# Patient Record
Sex: Male | Born: 1945 | Race: White | Hispanic: No | Marital: Married | Smoking: Never smoker
Health system: Southern US, Community
[De-identification: ages and names within clinical notes are randomized; demographics above are authoritative.]

## PROBLEM LIST (undated history)

## (undated) DIAGNOSIS — I10 Essential (primary) hypertension: Secondary | ICD-10-CM

## (undated) DIAGNOSIS — K219 Gastro-esophageal reflux disease without esophagitis: Secondary | ICD-10-CM

## (undated) DIAGNOSIS — E785 Hyperlipidemia, unspecified: Secondary | ICD-10-CM

## (undated) DIAGNOSIS — E039 Hypothyroidism, unspecified: Secondary | ICD-10-CM

## (undated) DIAGNOSIS — Z22322 Carrier or suspected carrier of Methicillin resistant Staphylococcus aureus: Secondary | ICD-10-CM

## (undated) DIAGNOSIS — Z8614 Personal history of Methicillin resistant Staphylococcus aureus infection: Secondary | ICD-10-CM

## (undated) DIAGNOSIS — E119 Type 2 diabetes mellitus without complications: Secondary | ICD-10-CM

## (undated) HISTORY — PX: TOE AMPUTATION: SHX809

## (undated) HISTORY — DX: Personal history of Methicillin resistant Staphylococcus aureus infection: Z86.14

## (undated) HISTORY — DX: Hypothyroidism, unspecified: E03.9

## (undated) HISTORY — DX: Carrier or suspected carrier of methicillin resistant Staphylococcus aureus: Z22.322

## (undated) HISTORY — DX: Essential (primary) hypertension: I10

## (undated) HISTORY — DX: Hyperlipidemia, unspecified: E78.5

## (undated) HISTORY — DX: Gastro-esophageal reflux disease without esophagitis: K21.9

## (undated) HISTORY — DX: Type 2 diabetes mellitus without complications: E11.9

---

## 2006-01-05 ENCOUNTER — Ambulatory Visit (HOSPITAL_COMMUNITY): Payer: Self-pay | Admitting: Pulmonary Disease

## 2006-01-05 ENCOUNTER — Encounter (HOSPITAL_COMMUNITY): Admission: RE | Admit: 2006-01-05 | Discharge: 2006-02-04 | Payer: Self-pay | Admitting: Pulmonary Disease

## 2006-01-06 ENCOUNTER — Encounter (HOSPITAL_COMMUNITY): Admission: RE | Admit: 2006-01-06 | Discharge: 2006-02-05 | Payer: Self-pay | Admitting: Pulmonary Disease

## 2006-02-05 ENCOUNTER — Encounter (HOSPITAL_COMMUNITY): Admission: RE | Admit: 2006-02-05 | Discharge: 2006-03-07 | Payer: Self-pay | Admitting: Oncology

## 2006-04-18 ENCOUNTER — Encounter (HOSPITAL_COMMUNITY): Admission: RE | Admit: 2006-04-18 | Discharge: 2006-05-18 | Payer: Self-pay | Admitting: Pulmonary Disease

## 2006-04-27 ENCOUNTER — Ambulatory Visit (HOSPITAL_COMMUNITY): Payer: Self-pay | Admitting: Pulmonary Disease

## 2006-05-19 ENCOUNTER — Encounter (HOSPITAL_COMMUNITY): Admission: RE | Admit: 2006-05-19 | Discharge: 2006-06-18 | Payer: Self-pay | Admitting: Pulmonary Disease

## 2011-11-08 HISTORY — PX: BELOW KNEE LEG AMPUTATION: SUR23

## 2012-05-06 DIAGNOSIS — D649 Anemia, unspecified: Secondary | ICD-10-CM | POA: Insufficient documentation

## 2014-06-11 ENCOUNTER — Encounter (HOSPITAL_BASED_OUTPATIENT_CLINIC_OR_DEPARTMENT_OTHER): Payer: Medicare HMO | Attending: General Surgery

## 2014-06-25 ENCOUNTER — Ambulatory Visit (HOSPITAL_COMMUNITY)
Admission: RE | Admit: 2014-06-25 | Discharge: 2014-06-25 | Disposition: A | Discharge status: Home / Self Care | Payer: Medicare HMO | Source: Ambulatory Visit | Attending: Nurse Practitioner | Admitting: Nurse Practitioner

## 2014-06-25 DIAGNOSIS — E119 Type 2 diabetes mellitus without complications: Secondary | ICD-10-CM | POA: Diagnosis not present

## 2014-06-25 DIAGNOSIS — S91109A Unspecified open wound of unspecified toe(s) without damage to nail, initial encounter: Secondary | ICD-10-CM | POA: Diagnosis not present

## 2014-06-25 DIAGNOSIS — IMO0001 Reserved for inherently not codable concepts without codable children: Secondary | ICD-10-CM | POA: Insufficient documentation

## 2014-06-25 NOTE — Progress Notes (Signed)
Physical Therapy - Wound Therapy  Evaluation   Patient Details  Name: Travis Simon MRN: [id number] Date of Birth: 02-12-1946  Today's Date: 06/25/2014 Time: 1015-1100 Time Calculation (min): 45 min   Charges: 1 Evaluation, Selective debridement <20cm Visit#: 1 of 8  Re-eval: 07/25/14  Subjective Subjective Assessment Subjective: Patient states he was feeling sick in late June when he noticed his foot was swelling when he foot slpit open because of the swelling between hi 2nd and 3 toes. Patient had a staph infection that has since been treated with medication and cleared. Patient has Rt BKA, Lt great toe amputations,  history of non healing wounds, diabetes, neuropathy,  3 heart attacks and history of triple bypass surgery. Patient is also HOH Patient and Family Stated Goals: to be able to walk without pain and for wound to heal Date of Onset: 05/07/14 Prior Treatments: home dressing changes twice daily with help from daughter who is a nurse  Pain Assessment No pain noted. Wound Therapy Wound / Incision (Open or Dehisced) 06/25/14 Other (Comment) Other (Comment) Left Wound located between 2nd and 3rd toe displayign minimal swelling with red tissue inside, difficulty to determine if granulating. Periwound area surrounded with dead skin.  (Active)  Dressing Type Gauze (Comment) 06/25/2014 12:44 PM  Dressing Changed Changed 06/25/2014 12:44 PM  Dressing Status Clean 06/25/2014 12:44 PM  Dressing Change Frequency Every other day 06/25/2014 12:44 PM  Site / Wound Assessment Red;Other (Comment) 06/25/2014 12:44 PM  % Wound base Red or Granulating 65% 06/25/2014 12:44 PM  % Wound base Yellow 15% 06/25/2014 12:44 PM  % Wound base Black 0% 06/25/2014 12:44 PM  % Wound base Other (Comment) 20% 06/25/2014 12:44 PM  Peri-wound Assessment Intact;Erythema (non-blanchable);Pink 06/25/2014 12:44 PM  Wound Length (cm) 1.6 cm 06/25/2014 12:44 PM  Wound Width (cm) 1 cm 06/25/2014 12:44 PM  Wound Depth (cm) 0.5  cm 06/25/2014 12:44 PM  Undermining (cm) 2 o'clock withdepth of .5cm 06/25/2014 12:44 PM  Margins Unattached edges (unapproximated) 06/25/2014 12:44 PM  Closure None 06/25/2014 12:44 PM  Drainage Amount Minimal 06/25/2014 12:44 PM  Drainage Description Serosanguineous;No odor 06/25/2014 12:44 PM  Treatment Cleansed;Debridement (Selective);Packing (Impregnated strip) 06/25/2014 12:44 PM   Selective Debridement Selective Debridement - Location: between 2nd and 3rd toe Selective Debridement - Tools Used: Forceps;Scalpel Selective Debridement - Tissue Removed: dead skin   Physical Therapy Assessment and Plan Wound Therapy - Assess/Plan/Recommendations Wound Therapy - Clinical Statement: Patient displays a non-healing wound between 2nd and 3rd toe attributed in limited circulation secondary to a previous staph infection. Patient's wound shows minimal exudate with slow healing. Honey was ultilized on packing due to slow healing to improve healing environement.  Wound Therapy - Functional Problem List: limited weightbearign on Lt LE and difficulty walking Factors Delaying/Impairing Wound Healing: Altered sensation;Diabetes Mellitus;Infection - systemic/local;Multiple medical problems;Vascular compromise Hydrotherapy Plan: Debridement;Dressing change Wound Therapy - Frequency:  (1 to 2x a week) Wound Plan: Dressign changes bi-daily performed at home independently with packing with gauze and honey to improve healing environment,       Goals Wound Therapy Goals - Improve the function of patient's integumentary system by progressing the wound(s) through the phases of wound healing by: Decrease Necrotic Tissue to: 0% Decrease Necrotic Tissue - Progress: Goal set today Increase Granulation Tissue to: 100% Increase Granulation Tissue - Progress: Goal set today Decrease Length/Width/Depth by (cm): 1.6x.5x1cm Decrease Length/Width/Depth - Progress: Goal set today Improve Drainage Characteristics:  Mod Improve Drainage Characteristics - Progress: Goal set today  Patient/Family will be able to : independently perform self dressing change every other day Patient/Family Instruction Goal - Progress: Goal set today Additional Wound Therapy Goal: patient will be able to tolerate full weight bearing on Lt LE to normalize gait Goals/treatment plan/discharge plan were made with and agreed upon by patient/family: Yes Time For Goal Achievement: Other (comment) (8 weeks) Wound Therapy - Potential for Goals: Good  Problem List There are no active problems to display for this patient.   GP Functional Assessment Tool Used: Colinical judgement Functional Limitation: Mobility: Walking and moving around Mobility: Walking and Moving Around Current Status [phone]([id number]): At least 60 percent but less than 80 percent impaired, limited or restricted Mobility: Walking and Moving Around Goal Status [phone](G8979): At least 40 percent but less than 60 percent impaired, limited or restricted  Travis Simon R 06/25/2014, 2:05 PM

## 2014-07-02 ENCOUNTER — Ambulatory Visit (HOSPITAL_COMMUNITY)
Admission: RE | Admit: 2014-07-02 | Discharge: 2014-07-02 | Disposition: A | Discharge status: Home / Self Care | Payer: Medicare HMO | Source: Ambulatory Visit | Attending: Nurse Practitioner | Admitting: Nurse Practitioner

## 2014-07-02 DIAGNOSIS — IMO0001 Reserved for inherently not codable concepts without codable children: Secondary | ICD-10-CM | POA: Diagnosis not present

## 2014-07-02 NOTE — Progress Notes (Signed)
Physical Therapy - Wound Therapy  Treatment   Patient Details  Name: Travis Simon MRN: [id number] Date of Birth: [birth year]  Today's Date: 07/02/2014 Time: 4098-1191 Time Calculation (min): 20 min Charge: selective debridement <20 cm  Visit#: 2 of 8  Re-eval: 07/25/14  Subjective Subjective Assessment Subjective: Pain free today, compliant with dressing changes  Pain Assessment Pain Assessment Pain Assessment: No/denies pain  Wound Therapy  07/02/14 1418  Subjective Assessment  Subjective Pain free today, compliant with dressing changes  Wound / Incision (Open or Dehisced)  Date First Assessed/Time First Assessed: 06/25/14 1024   Wound Type: (c) Other (Comment)  Location: (c) Other (Comment)  Location Orientation: Left  Wound Description (Comments): Wound located between 2nd and 3rd toe displayign minimal swelling with red   Dressing Type Other (Comment);Gauze (Comment) (medihoney)  Dressing Changed Changed  Dressing Status Clean  Site / Wound Assessment Red;Other (Comment) (minimal exudate)  % Wound base Red or Granulating 70%  % Wound base Yellow 10%  % Wound base Black 0%  % Wound base Other (Comment) 20% (non-granulated tissue)  Closure None  Drainage Amount Scant  Drainage Description Serosanguineous;No odor  Treatment Cleansed;Debridement (Selective)  Selective Debridement  Selective Debridement - Location between 2nd and 3rd toe  Selective Debridement - Tools Used Forceps;Scalpel;Other (comment) (qtips)  Selective Debridement - Tissue Removed dead skin  Wound Therapy - Assess/Plan/Recommendations  Wound Therapy - Clinical Statement Noted decreased depth of overall weight.  Slough decreased following minimal seletive debridement with forceps and qtips.  Continued with medihoney and gauze dressings.  No reports of pain or signs of infection  Hydrotherapy Plan Debridement;Dressing change  Wound Plan Dressign changes bi-daily performed at home independently with  packing with gauze and honey to improve healing environment,     Selective Debridement Selective Debridement - Location: between 2nd and 3rd toe Selective Debridement - Tools Used: Forceps;Scalpel;Other (comment) (qtips) Selective Debridement - Tissue Removed: dead skin   Physical Therapy Assessment and Plan Wound Therapy - Assess/Plan/Recommendations Wound Therapy - Clinical Statement: Noted decreased depth of overall weight.  Slough decreased following minimal seletive debridement with forceps and qtips.  Continued with medihoney and gauze dressings.  No reports of pain or signs of infection Hydrotherapy Plan: Debridement;Dressing change Wound Plan: Dressign changes bi-daily performed at home independently with packing with gauze and honey to improve healing environment,       Goals    Problem List There are no active problems to display for this patient.   GP    Juel Burrow 07/02/2014, 2:47 PM

## 2014-07-09 ENCOUNTER — Ambulatory Visit (HOSPITAL_COMMUNITY)
Admission: RE | Admit: 2014-07-09 | Discharge: 2014-07-09 | Disposition: A | Discharge status: Home / Self Care | Payer: Medicare HMO | Source: Ambulatory Visit | Attending: Nurse Practitioner | Admitting: Nurse Practitioner

## 2014-07-09 DIAGNOSIS — E119 Type 2 diabetes mellitus without complications: Secondary | ICD-10-CM | POA: Insufficient documentation

## 2014-07-09 DIAGNOSIS — S91109A Unspecified open wound of unspecified toe(s) without damage to nail, initial encounter: Secondary | ICD-10-CM | POA: Diagnosis not present

## 2014-07-09 DIAGNOSIS — IMO0001 Reserved for inherently not codable concepts without codable children: Secondary | ICD-10-CM | POA: Insufficient documentation

## 2014-07-09 NOTE — Progress Notes (Signed)
Physical Therapy - Wound Therapy  Treatment   Patient Details  Name: Travis Simon MRN: [id number] Date of Birth: Oct 02, 1946  Today's Date: 07/09/2014 Time: 4098-1191 Time Calculation (min): 20 min Visit#: 3 of 8  Re-eval: 07/25/14 Charges:  Deb<20cm  Subjective Subjective Assessment Subjective: Pt comes today without dressing on Lt foot, states he just removed it prior to session.  PT reports complaince with dressing changes at home using medihoney gel.     Wound Therapy Wound / Incision (Open or Dehisced) 06/25/14 Other (Comment) Other (Comment) Left Wound located between 2nd and 3rd toe displayign minimal swelling with red tissue inside, difficulty to determine if granulating. Periwound area surrounded with dead skin.  (Active)  Dressing Type medihoney gel gauze packed into wound, 3"conform to secure 07/09/2014 12:00 PM  Dressing Changed Changed 07/09/2014 12:00 PM  Dressing Status Clean 07/09/2014 12:00 PM  Dressing Change Frequency daily 06/25/2014 12:44 PM  Site / Wound Assessment Red;Other (Comment) 07/09/2014 12:00 PM  % Wound base Red or Granulating 70% 07/09/2014 12:00 PM  % Wound base Yellow 10% 07/09/2014 12:00 PM  % Wound base Black 0% 07/09/2014 12:00 PM  % Wound base Other (Comment) 20% 07/09/2014 12:00 PM  Peri-wound Assessment Intact;Erythema (non-blanchable);Pink 06/25/2014 12:44 PM  Wound Length (cm) 1.6 cm 07/09/2014 12:00 PM  Wound Width (cm) 1 cm 07/09/2014 12:00 PM  Wound Depth (cm) 0.5 cm 07/09/2014 12:00 PM  Undermining (cm) 2 o'clock withdepth of .5cm 06/25/2014 12:44 PM  Margins Unattached edges (unapproximated) 06/25/2014 12:44 PM  Closure None 07/09/2014 12:00 PM  Drainage Amount Scant 07/09/2014 12:00 PM  Drainage Description Serosanguineous;No odor 07/09/2014 12:00 PM  Treatment Cleansed;Debridement (Selective);Packing (Saline gauze) 07/09/2014 12:00 PM   Selective Debridement Selective Debridement - Location: between 2nd and 3rd toe Selective Debridement - Tools Used:  Forceps;Scalpel;Other (comment) (qtips) Selective Debridement - Tissue Removed: dead skin   Physical Therapy Assessment and Plan Wound Therapy - Assess/Plan/Recommendations Wound Therapy - Clinical Statement: Wound scabbed over with depth revealed upon removal.  Continues to drain; reminded patinet to keep wound packed so it does not heal over. Continued dressing with medihoney gel packing and 3" conform secured around foot and wound.    Hydrotherapy Plan: Debridement;Dressing change Wound Plan: Instuct patient to decrease dressing changes to once daily next visit (is currently changing 2X day).       Problem List There are no active problems to display for this patient.    Lurena Nida, PTA/CLT 07/09/2014, 12:24 PM

## 2014-07-15 ENCOUNTER — Telehealth (HOSPITAL_COMMUNITY): Payer: Self-pay

## 2014-07-16 ENCOUNTER — Ambulatory Visit (HOSPITAL_COMMUNITY): Payer: Medicare HMO | Admitting: Physical Therapy

## 2014-07-23 ENCOUNTER — Ambulatory Visit (HOSPITAL_COMMUNITY)
Admission: RE | Admit: 2014-07-23 | Discharge: 2014-07-23 | Disposition: A | Discharge status: Home / Self Care | Payer: Medicare HMO | Source: Ambulatory Visit | Attending: Nurse Practitioner | Admitting: Nurse Practitioner

## 2014-07-23 DIAGNOSIS — IMO0001 Reserved for inherently not codable concepts without codable children: Secondary | ICD-10-CM | POA: Diagnosis not present

## 2014-07-23 NOTE — Progress Notes (Signed)
Physical Therapy - Wound Therapy Discharge summary  Discharge   Patient Details  Name: Travis Simon MRN: [id number] Date of Birth: Mar 21, 1946  Today's Date: 07/23/2014 Time: 1100-1115 Time Calculation (min): 15 min  Visit#: 4 of 8  Re-eval: 07/25/14 Charges:  Self care 15 minutes  Subjective Subjective Assessment Subjective: Pt comes today stating he believes his wound is healed.  States he quit dressing it because there was nothing to pack.  Pt reports no other problems.  painfree   Wound Therapy Wound / Incision (Open or Dehisced) 06/25/14 Other (Comment) Other (Comment) Left Wound located between 2nd and 3rd toe displayign minimal swelling with red tissue inside, difficulty to determine if granulating. Periwound area surrounded with dead skin.  (Active)  Dressing Type None 07/23/2014 11:00 AM  Dressing Changed Changed 07/09/2014 12:00 PM  Dressing Status Clean 07/23/2014 11:00 AM  Dressing Change Frequency Every other day 06/25/2014 12:44 PM  Site / Wound Assessment Red;Other (Comment) 07/23/2014 11:00 AM  % Wound base Red or Granulating 100% 07/23/2014 11:00 AM  % Wound base Yellow 0% 07/23/2014 11:00 AM  % Wound base Black 0% 07/23/2014 11:00 AM  % Wound base Other (Comment) 0% 07/23/2014 11:00 AM  Peri-wound Assessment Intact;Erythema (non-blanchable);Pink 06/25/2014 12:44 PM  Wound Length (cm) 0 (was 1.6 cm) 07/09/2014 12:00 PM  Wound Width (cm) 0 (was 1 cm) 07/09/2014 12:00 PM  Wound Depth (cm) 0 (was 0.5 cm) 07/09/2014 12:00 PM  Undermining (cm) None (was 0.5cm @ 2:00) 06/25/2014 12:44 PM  Margins approximated 06/25/2014 12:44 PM  Closure Closed 07/23/2014 11:00 AM  Drainage Amount None 07/23/2014 11:00 AM  Drainage Description None 07/23/2014 11:00 AM  Treatment None 07/09/2014 12:00 PM   Selective Debridement Selective Debridement - Location: No debridment needed, no wound found   Physical Therapy Assessment and Plan Wound Therapy - Assess/Plan/Recommendations Wound Therapy -  Clinical Statement: Wound healed between 2nd/3rd toes.  continued redness at dorsum of both toes, however no signs/symptoms of infection.  Noted was one small wound on lateral aspect of 3rd toe.  wound measures 0.1X0.1X 0.2 cm.  Pt declined treatment at this time as he is returning to podiatrist at [facility] tomorrow.  Pt states his daughter is a [employer] scientist and will be able to take care of this new wound.  Pt instructed to use the medihoney and keep it covered.  Pt also instructed to moisturize feet to keep skin from cracking to prevent wounds.  Pt verbalized understanding.  Wound Plan: Discharge per original wound is healed and patient request.      Goals Wound Therapy Goals - Improve the function of patient's integumentary system by progressing the wound(s) through the phases of wound healing by: Decrease Necrotic Tissue to: 0% Decrease Necrotic Tissue - Progress: Met Increase Granulation Tissue to: 100% Increase Granulation Tissue - Progress: Met Decrease Length/Width/Depth by (cm): 1.6x.5x1cm Decrease Length/Width/Depth - Progress: Met Improve Drainage Characteristics: Mod Improve Drainage Characteristics - Progress: Met Patient/Family will be able to : independently perform self dressing change every other day Patient/Family Instruction Goal - Progress: Met Additional Wound Therapy Goal: patient will be able to tolerate full weight bearing on Lt LE to normalize gait Additional Wound Therapy Goal - Progress: Met Goals/treatment plan/discharge plan were made with and agreed upon by patient/family: Yes Time For Goal Achievement: Other (comment) (8 weeks) Wound Therapy - Potential for Goals: Good   GP Functional Assessment Tool Used: Clinical judgement Functional Limitation: Mobility: Walking and moving around Mobility: Walking and Moving Around Goal  Status [phone]): At least 40 percent but less than 60 percent impaired, limited or restricted Mobility: Walking and Moving Around Discharge Status  [phone]): At least 20 percent but less than 40 percent impaired, limited or restricted  Teena Irani, PTA/CLT 07/23/2014, 11:25 AM

## 2014-07-23 NOTE — Progress Notes (Signed)
Physical Therapy - Wound Therapy Discharge summary  Discharge   Patient Details  Name: Travis Simon MRN: [id number] Date of Birth: Mar 21, 1946  Today's Date: 07/23/2014 Time: 1100-1115 Time Calculation (min): 15 min  Visit#: 4 of 8  Re-eval: 07/25/14 Charges:  Self care 15 minutes  Subjective Subjective Assessment Subjective: Pt comes today stating he believes his wound is healed.  States he quit dressing it because there was nothing to pack.  Pt reports no other problems.  painfree   Wound Therapy Wound / Incision (Open or Dehisced) 06/25/14 Other (Comment) Other (Comment) Left Wound located between 2nd and 3rd toe displayign minimal swelling with red tissue inside, difficulty to determine if granulating. Periwound area surrounded with dead skin.  (Active)  Dressing Type None 07/23/2014 11:00 AM  Dressing Changed Changed 07/09/2014 12:00 PM  Dressing Status Clean 07/23/2014 11:00 AM  Dressing Change Frequency Every other day 06/25/2014 12:44 PM  Site / Wound Assessment Red;Other (Comment) 07/23/2014 11:00 AM  % Wound base Red or Granulating 100% 07/23/2014 11:00 AM  % Wound base Yellow 0% 07/23/2014 11:00 AM  % Wound base Black 0% 07/23/2014 11:00 AM  % Wound base Other (Comment) 0% 07/23/2014 11:00 AM  Peri-wound Assessment Intact;Erythema (non-blanchable);Pink 06/25/2014 12:44 PM  Wound Length (cm) 0 (was 1.6 cm) 07/09/2014 12:00 PM  Wound Width (cm) 0 (was 1 cm) 07/09/2014 12:00 PM  Wound Depth (cm) 0 (was 0.5 cm) 07/09/2014 12:00 PM  Undermining (cm) None (was 0.5cm @ 2:00) 06/25/2014 12:44 PM  Margins approximated 06/25/2014 12:44 PM  Closure Closed 07/23/2014 11:00 AM  Drainage Amount None 07/23/2014 11:00 AM  Drainage Description None 07/23/2014 11:00 AM  Treatment None 07/09/2014 12:00 PM   Selective Debridement Selective Debridement - Location: No debridment needed, no wound found   Physical Therapy Assessment and Plan Wound Therapy - Assess/Plan/Recommendations Wound Therapy -  Clinical Statement: Wound healed between 2nd/3rd toes.  continued redness at dorsum of both toes, however no signs/symptoms of infection.  Noted was one small wound on lateral aspect of 3rd toe.  wound measures 0.1X0.1X 0.2 cm.  Pt declined treatment at this time as he is returning to podiatrist at [facility] tomorrow.  Pt states his daughter is a Marine scientist and will be able to take care of this new wound.  Pt instructed to use the medihoney and keep it covered.  Pt also instructed to moisturize feet to keep skin from cracking to prevent wounds.  Pt verbalized understanding.  Wound Plan: Discharge per original wound is healed and patient request.      Goals Wound Therapy Goals - Improve the function of patient's integumentary system by progressing the wound(s) through the phases of wound healing by: Decrease Necrotic Tissue to: 0% Decrease Necrotic Tissue - Progress: Met Increase Granulation Tissue to: 100% Increase Granulation Tissue - Progress: Met Decrease Length/Width/Depth by (cm): 1.6x.5x1cm Decrease Length/Width/Depth - Progress: Met Improve Drainage Characteristics: Mod Improve Drainage Characteristics - Progress: Met Patient/Family will be able to : independently perform self dressing change every other day Patient/Family Instruction Goal - Progress: Met Additional Wound Therapy Goal: patient will be able to tolerate full weight bearing on Lt LE to normalize gait Additional Wound Therapy Goal - Progress: Met Goals/treatment plan/discharge plan were made with and agreed upon by patient/family: Yes Time For Goal Achievement: Other (comment) (8 weeks) Wound Therapy - Potential for Goals: Good   GP Functional Assessment Tool Used: Clinical judgement Functional Limitation: Mobility: Walking and moving around Mobility: Walking and Moving Around Goal  Status [phone]): At least 40 percent but less than 60 percent impaired, limited or restricted Mobility: Walking and Moving Around Discharge Status  [phone]): At least 20 percent but less than 40 percent impaired, limited or restricted  Devona Konig PT DPT  Teena Irani, PTA/CLT 07/23/2014, 11:25 AM

## 2014-07-30 ENCOUNTER — Ambulatory Visit (HOSPITAL_COMMUNITY)
Admission: RE | Admit: 2014-07-30 | Payer: Medicare HMO | Source: Ambulatory Visit | Attending: Nurse Practitioner | Admitting: Nurse Practitioner

## 2018-11-09 ENCOUNTER — Ambulatory Visit: Payer: Self-pay | Admitting: Podiatry

## 2018-11-19 ENCOUNTER — Encounter: Payer: Self-pay | Admitting: Podiatry

## 2018-11-19 ENCOUNTER — Ambulatory Visit (INDEPENDENT_AMBULATORY_CARE_PROVIDER_SITE_OTHER): Payer: Medicare HMO | Admitting: Podiatry

## 2018-11-19 ENCOUNTER — Ambulatory Visit (INDEPENDENT_AMBULATORY_CARE_PROVIDER_SITE_OTHER): Payer: Medicare HMO

## 2018-11-19 VITALS — BP 131/58

## 2018-11-19 DIAGNOSIS — E1142 Type 2 diabetes mellitus with diabetic polyneuropathy: Secondary | ICD-10-CM | POA: Diagnosis not present

## 2018-11-19 DIAGNOSIS — B351 Tinea unguium: Secondary | ICD-10-CM

## 2018-11-19 DIAGNOSIS — M205X2 Other deformities of toe(s) (acquired), left foot: Secondary | ICD-10-CM | POA: Diagnosis not present

## 2018-11-19 DIAGNOSIS — M869 Osteomyelitis, unspecified: Secondary | ICD-10-CM | POA: Diagnosis not present

## 2018-11-19 NOTE — Patient Instructions (Signed)
Diabetes Mellitus and Foot Care  Foot care is an important part of your health, especially when you have diabetes. Diabetes may cause you to have problems because of poor blood flow (circulation) to your feet and legs, which can cause your skin to:   Become thinner and drier.   Break more easily.   Heal more slowly.   Peel and crack.  You may also have nerve damage (neuropathy) in your legs and feet, causing decreased feeling in them. This means that you may not notice minor injuries to your feet that could lead to more serious problems. Noticing and addressing any potential problems early is the best way to prevent future foot problems.  How to care for your feet  Foot hygiene   Wash your feet daily with warm water and mild soap. Do not use hot water. Then, pat your feet and the areas between your toes until they are completely dry. Do not soak your feet as this can dry your skin.   Trim your toenails straight across. Do not dig under them or around the cuticle. File the edges of your nails with an emery board or nail file.   Apply a moisturizing lotion or petroleum jelly to the skin on your feet and to dry, brittle toenails. Use lotion that does not contain alcohol and is unscented. Do not apply lotion between your toes.  Shoes and socks   Wear clean socks or stockings every day. Make sure they are not too tight. Do not wear knee-high stockings since they may decrease blood flow to your legs.   Wear shoes that fit properly and have enough cushioning. Always look in your shoes before you put them on to be sure there are no objects inside.   To break in new shoes, wear them for just a few hours a day. This prevents injuries on your feet.  Wounds, scrapes, corns, and calluses   Check your feet daily for blisters, cuts, bruises, sores, and redness. If you cannot see the bottom of your feet, use a mirror or ask someone for help.   Do not cut corns or calluses or try to remove them with medicine.   If you  find a minor scrape, cut, or break in the skin on your feet, keep it and the skin around it clean and dry. You may clean these areas with mild soap and water. Do not clean the area with peroxide, alcohol, or iodine.   If you have a wound, scrape, corn, or callus on your foot, look at it several times a day to make sure it is healing and not infected. Check for:  ? Redness, swelling, or pain.  ? Fluid or blood.  ? Warmth.  ? Pus or a bad smell.  General instructions   Do not cross your legs. This may decrease blood flow to your feet.   Do not use heating pads or hot water bottles on your feet. They may burn your skin. If you have lost feeling in your feet or legs, you may not know this is happening until it is too late.   Protect your feet from hot and cold by wearing shoes, such as at the beach or on hot pavement.   Schedule a complete foot exam at least once a year (annually) or more often if you have foot problems. If you have foot problems, report any cuts, sores, or bruises to your health care provider immediately.  Contact a health care provider if:     You have a medical condition that increases your risk of infection and you have any cuts, sores, or bruises on your feet.   You have an injury that is not healing.   You have redness on your legs or feet.   You feel burning or tingling in your legs or feet.   You have pain or cramps in your legs and feet.   Your legs or feet are numb.   Your feet always feel cold.   You have pain around a toenail.  Get help right away if:   You have a wound, scrape, corn, or callus on your foot and:  ? You have pain, swelling, or redness that gets worse.  ? You have fluid or blood coming from the wound, scrape, corn, or callus.  ? Your wound, scrape, corn, or callus feels warm to the touch.  ? You have pus or a bad smell coming from the wound, scrape, corn, or callus.  ? You have a fever.  ? You have a red line going up your leg.  Summary   Check your feet every day  for cuts, sores, red spots, swelling, and blisters.   Moisturize feet and legs daily.   Wear shoes that fit properly and have enough cushioning.   If you have foot problems, report any cuts, sores, or bruises to your health care provider immediately.   Schedule a complete foot exam at least once a year (annually) or more often if you have foot problems.  This information is not intended to replace advice given to you by your health care provider. Make sure you discuss any questions you have with your health care provider.  Document Released: 10/21/2000 Document Revised: 12/06/2017 Document Reviewed: 11/25/2016  [employer] Interactive Patient Education  2019 [employer]

## 2018-11-19 NOTE — Progress Notes (Signed)
Subjective: Travis Simon presents today with diabetes and diabetic neuropathy.  He is a veteran usually seen at the [facility].  He was referred by the [facility] for podiatry.  He states approximately 4 to 5 months ago he had a blister on his left second digit.  He states this started with him walking in the river and digging his toes into the into the ground so he would not move with the current of the water.  This resulted in the formation of the blister.  He was seen by podiatry who removed his toenail.  He did have an x-ray done at the [facility] and no infection of the bone was discovered.  He also had an MRI done in December 2019 at the [facility] which also confirmed no osteomyelitis of the left second digit.  He has a new pair of diabetic shoes today.  Infectious disease history: He has history of MRSA  Amputation history: 1.  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 2.  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  Past Medical History:  Diagnosis Date  . 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 Diabetes (HCC)   . GERD (gastroesophageal reflux disease)   . History of methicillin resistant staphylococcus aureus (MRSA)   . Hyperlipidemia   . Hypertension   . Hypothyroidism    Patient Active Problem List   Diagnosis Date Noted  . Anemia 05/06/2012    History reviewed. No pertinent surgical history.  Past Surgical History:  Procedure Laterality Date  . BELOW KNEE LEG AMPUTATION Right 2013  . TOE AMPUTATION Left    Hallux    Current Outpatient Medications:  .  aspirin EC 81 MG tablet, Take by mouth., Disp: , Rfl:  .  furosemide (LASIX) 20 MG tablet, Take 20 mg by mouth., Disp: , Rfl:  .  glipiZIDE (GLUCOTROL) 5 MG tablet, Take by mouth 2 (two) times daily., Disp: , Rfl:  .  guaiFENesin 200 MG tablet, Take 200 mg by mouth 3 (three) times daily. 3 tablets BID, Disp: , Rfl:  .  insulin  aspart (NOVOLOG) 100 UNIT/ML injection, Inject into the skin., Disp: , Rfl:  .  insulin glargine (LANTUS) 100 UNIT/ML injection, Inject into the skin., Disp: , Rfl:  .  levothyroxine (SYNTHROID, LEVOTHROID) 50 MCG tablet, Take by mouth., Disp: , Rfl:  .  lisinopril (PRINIVIL,ZESTRIL) 5 MG tablet, Take by mouth., Disp: , Rfl:  .  metoprolol tartrate (LOPRESSOR) 25 MG tablet, Take by mouth., Disp: , Rfl:  .  pantoprazole (PROTONIX) 40 MG tablet, Take by mouth., Disp: , Rfl:  .  Pediatric Multiple Vitamins (THERA MULTI-VITAMIN PO), Take by mouth., Disp: , Rfl:  .  simvastatin (ZOCOR) 40 MG tablet, Take by mouth., Disp: , Rfl:     Social History   Occupational History  . Not on file  Tobacco Use  . Smoking status: Never Smoker  . Smokeless tobacco: Never Used  Substance and Sexual Activity  . Alcohol use: Not on file  . Drug use: Not on file  . Sexual activity: Not on file     History reviewed. No pertinent family history.   Immunization History  Administered Date(s) Administered  . Influenza,inj,Quad PF,6+ Mos 07/31/2014     Review of systems: Positive Findings in bold print.  Constitutional:  chills, fatigue, fever, sweats, weight change Communication: Nurse, learning disability, sign Presenter, broadcasting, hand writing, iPad/Android device  Head: headaches, head injury Eyes: changes in vision, eye pain, glaucoma, cataracts, macular degeneration, diplopia, glare,  light sensitivity, eyeglasses or contacts, blindness Ears nose mouth throat: Hard of hearing, hearing aids, ringing in ears, deaf, sign language,  vertigo,   nosebleeds,  rhinitis,  cold sores, snoring, swollen glands Cardiovascular: HTN, edema, arrhythmia, pacemaker in place, defibrillator in place,  chest pain/tightness, chronic anticoagulation, blood clot, heart failure Peripheral Vascular: leg cramps, varicose veins, blood clots, lymphedema Respiratory:  difficulty breathing, denies congestion, SOB, wheezing, cough,  emphysema Gastrointestinal: change in appetite or weight, abdominal pain, constipation, diarrhea, nausea, vomiting, vomiting blood, change in bowel habits, abdominal pain, jaundice, rectal bleeding, hemorrhoids, reflux Genitourinary:  nocturia,  pain on urination,  blood in urine, Foley catheter, urinary urgency Musculoskeletal: uses mobility aid,  cramping, stiff joints, painful joints, decreased joint motion, fractures, OA, gout, amputation Skin: +changes in toenails, color change, dryness, itching, mole changes,  rash  Neurological: headaches, numbness in feet, paresthesias in feet, burning in feet, fainting,  seizures, change in speech. denies headaches, memory problems/poor historian, cerebral palsy, weakness, paralysis Endocrine: diabetes, hypothyroidism, hyperthyroidism,  goiter, dry mouth, flushing, heat intolerance,  cold intolerance,  excessive thirst, denies polyuria,  nocturia Hematological:  easy bleeding, excessive bleeding, easy bruising, enlarged lymph nodes, on long term blood thinner, history of past transusions Allergy/immunological:  hives, eczema, frequent infections, multiple drug allergies, seasonal allergies, transplant recipient Psychiatric:  anxiety, depression, mood disorder, suicidal ideations, hallucinations   Objective: Vitals:   11/19/18 1532  BP: (!) 131/58   Vascular Examination: Capillary refill time <3 seconds x 4 digits Dorsalis pedis and posterior tibial pulses present left lower extremity  No digital hair x 4 digits left foot  skin temperature gradient within normal limits left lower extremity  Dermatological Examination: Skin with normal turgor, texture and tone left lower extremity  Toenails 3 through 5 left foot noted to be mildly elongated, thickened, and discolored with subungual debris.  There is no erythema, no edema, no drainage noted.  Left second digit noted to have characteristics of claw toe deformity.  There is chronic edema of this digit.   The digit is devoid of the nail.  There is mild hyperkeratosis noted at the nail bed and distal tip of the digit.  There is no drainage expressed from this digit.  Musculoskeletal: Muscle strength 5/5 to all LLE muscle groups Claw toe deformity left second digit with medial drifting  Neurological: Sensation diminished with 10 gram monofilament left lower extremity Vibratory sensation diminished left lower extremity  X-rays left foot: Calcified arteries are noted on x-ray He does have evidence of claw toe deformity on x-ray however bones appear to remain intact with no osteolysis of bone.  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 Discussed findings in depth with patient and his wife on today. 2. I do not see any evidence of osteomyelitis on x-ray which was performed at an office today.  He did have an MRI done at [facility] last month and I am requesting those records to review the MRI report before we go any further. 3. We did discuss a toe filler for his left shoe.  He states he had has a podiatrist in [place] who took a mold of his foot.  He is puzzled as to why he did not receive the toe filler for this shoe.  He will call the Pedorthist to get clarification regarding this insert. 4.  In the meantime he is to continue wearing soft supportive shoe gear 5. Mr. Richardson DoppCole or his wife are to report any pedal injuries to medical professional immediately. 6.  Patient/POA to call should there be a concern in the interim.

## 2018-11-21 ENCOUNTER — Telehealth: Payer: Self-pay | Admitting: Podiatry

## 2018-11-21 NOTE — Telephone Encounter (Signed)
Called pt and told him per Dr. Eloy End that his MRI from November did not show Osteomyelitis. I asked if he wanted to schedule a follow up appointment and pt stated if he needs [place] again he will contact the [place] then [place].

## 2020-01-03 ENCOUNTER — Other Ambulatory Visit (HOSPITAL_COMMUNITY): Payer: Self-pay | Admitting: Internal Medicine

## 2020-01-03 DIAGNOSIS — I359 Nonrheumatic aortic valve disorder, unspecified: Secondary | ICD-10-CM

## 2020-01-07 ENCOUNTER — Telehealth (HOSPITAL_COMMUNITY): Payer: Self-pay | Admitting: Emergency Medicine

## 2020-01-07 NOTE — Telephone Encounter (Signed)
Reaching out to patient to offer assistance regarding upcoming cardiac imaging study; pt verbalizes understanding of appt date/time, parking situation and where to check in, and verified current allergies; name and call back number provided for further questions should they arise Rockwell Alexandria RN Navigator Cardiac Imaging [facility] [phone] office [phone] cell  Spoke with wife who states that patient is hearing impaired and memory impaired.  Wife denies that patient has implanted devices, but has a prosthetic leg. Denies shrapnel, denies claustro  [place] Dec

## 2020-01-08 ENCOUNTER — Other Ambulatory Visit: Payer: Self-pay

## 2020-01-08 ENCOUNTER — Ambulatory Visit (HOSPITAL_COMMUNITY)
Admission: RE | Admit: 2020-01-08 | Discharge: 2020-01-08 | Disposition: A | Discharge status: Home / Self Care | Payer: No Typology Code available for payment source | Source: Ambulatory Visit | Attending: Internal Medicine | Admitting: Internal Medicine

## 2020-01-08 DIAGNOSIS — I359 Nonrheumatic aortic valve disorder, unspecified: Secondary | ICD-10-CM | POA: Insufficient documentation

## 2020-01-08 LAB — CREATININE, SERUM
Creatinine, Ser: 1.41 mg/dL — ABNORMAL HIGH (ref 0.61–1.24)
GFR calc Af Amer: 56 mL/min — ABNORMAL LOW (ref >60)
GFR calc non Af Amer: 49 mL/min — ABNORMAL LOW (ref >60)

## 2021-04-05 ENCOUNTER — Other Ambulatory Visit (HOSPITAL_COMMUNITY)
Admission: RE | Admit: 2021-04-05 | Discharge: 2021-04-05 | Disposition: A | Discharge status: Home / Self Care | Payer: Medicare HMO | Source: Ambulatory Visit | Attending: Internal Medicine | Admitting: Internal Medicine

## 2021-04-05 DIAGNOSIS — M869 Osteomyelitis, unspecified: Secondary | ICD-10-CM | POA: Insufficient documentation

## 2021-04-05 DIAGNOSIS — Z5181 Encounter for therapeutic drug level monitoring: Secondary | ICD-10-CM | POA: Diagnosis not present

## 2021-04-05 LAB — CBC WITH DIFFERENTIAL/PLATELET
Abs Immature Granulocytes: 0.04 10*3/uL (ref 0.00–0.07)
Basophils Absolute: 0.1 10*3/uL (ref 0.0–0.1)
Basophils Relative: 1 %
Eosinophils Absolute: 0.5 10*3/uL (ref 0.0–0.5)
Eosinophils Relative: 6 %
HCT: 36 % — ABNORMAL LOW (ref 39.0–52.0)
Hemoglobin: 11.3 g/dL — ABNORMAL LOW (ref 13.0–17.0)
Immature Granulocytes: 1 %
Lymphocytes Relative: 18 %
Lymphs Abs: 1.5 10*3/uL (ref 0.7–4.0)
MCH: 28.3 pg (ref 26.0–34.0)
MCHC: 31.4 g/dL (ref 30.0–36.0)
MCV: 90 fL (ref 80.0–100.0)
Monocytes Absolute: 0.7 10*3/uL (ref 0.1–1.0)
Monocytes Relative: 8 %
Neutro Abs: 5.5 10*3/uL (ref 1.7–7.7)
Neutrophils Relative %: 66 %
Platelets: 348 10*3/uL (ref 150–400)
RBC: 4 MIL/uL — ABNORMAL LOW (ref 4.22–5.81)
RDW: 13.9 % (ref 11.5–15.5)
WBC: 8.2 10*3/uL (ref 4.0–10.5)
nRBC: 0 % (ref 0.0–0.2)

## 2021-04-05 LAB — COMPREHENSIVE METABOLIC PANEL
ALT: <5 U/L (ref 0–44)
AST: 21 U/L (ref 15–41)
Albumin: 3 g/dL — ABNORMAL LOW (ref 3.5–5.0)
Alkaline Phosphatase: 73 U/L (ref 38–126)
Anion gap: 9 (ref 5–15)
BUN: 28 mg/dL — ABNORMAL HIGH (ref 8–23)
CO2: 25 mmol/L (ref 22–32)
Calcium: 8.9 mg/dL (ref 8.9–10.3)
Chloride: 102 mmol/L (ref 98–111)
Creatinine, Ser: 1.48 mg/dL — ABNORMAL HIGH (ref 0.61–1.24)
GFR, Estimated: 49 mL/min — ABNORMAL LOW (ref >60)
Glucose, Bld: 205 mg/dL — ABNORMAL HIGH (ref 70–99)
Potassium: 4.4 mmol/L (ref 3.5–5.1)
Sodium: 136 mmol/L (ref 135–145)
Total Bilirubin: 0.3 mg/dL (ref 0.3–1.2)
Total Protein: 7 g/dL (ref 6.5–8.1)

## 2021-12-16 IMAGING — MR MR CARDIA MORPHOLOGY W/O CM
42 of 44 series · 42 of 44 positions shown · non-contrast
Comparison: none

CLINICAL DATA: 73M CAD s/p CABG, PAD, DM. Evaluate aortic stenosis

EXAM:
CARDIAC MRI
TECHNIQUE: The patient was scanned on a 1.5 Tesla Siemens magnet. A dedicated
cardiac coil was used. Functional imaging was done using Fiesta
sequences. [DATE], and 4 chamber views were done to assess for RWMA's.
Modified Sapienza rule using a short axis stack was used to
calculate an ejection fraction on a dedicated work station using
Circle software. No contrast given.

[Series 6: bSSFP · oblique · 8.0mm · 1.47mm/px · 1 of 25 slices shown (1 of 18)]
[im 1/25]
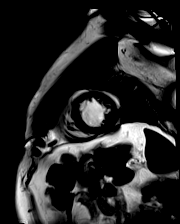

[Series 6: bSSFP · oblique · 8.0mm · 1.47mm/px · 1 of 25 slices shown (2 of 18)]
[im 1/25]
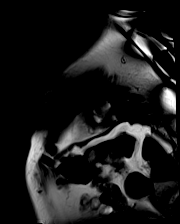

[Series 6: bSSFP · oblique · 8.0mm · 1.47mm/px · 1 of 25 slices shown (3 of 18)]
[im 1/25]
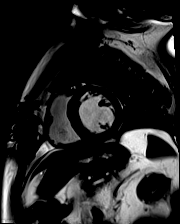

[Series 6: bSSFP · oblique · 8.0mm · 1.47mm/px · 1 of 25 slices shown (4 of 18)]
[im 1/25]
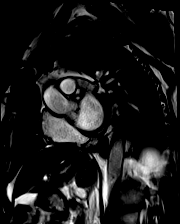

[Series 6: bSSFP · oblique · 8.0mm · 1.47mm/px · 1 of 25 slices shown (5 of 18)]
[im 1/25]
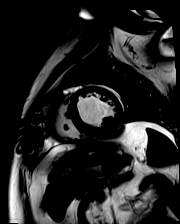

[Series 6: bSSFP · oblique · 8.0mm · 1.47mm/px · 1 of 25 slices shown (6 of 18)]
[im 1/25]
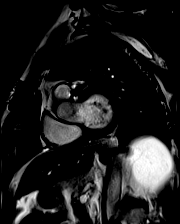

[Series 6: bSSFP · oblique · 8.0mm · 1.47mm/px · 1 of 25 slices shown (7 of 18)]
[im 1/25]
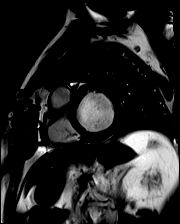

[Series 6: bSSFP · oblique · 8.0mm · 1.47mm/px · 1 of 25 slices shown (8 of 18)]
[im 1/25]
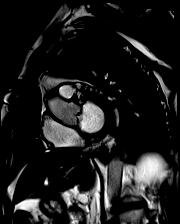

[Series 6: bSSFP · oblique · 8.0mm · 1.47mm/px · 1 of 25 slices shown (9 of 18)]
[im 1/25]
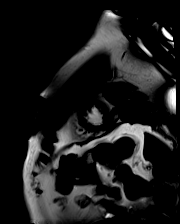

[Series 6: bSSFP · oblique · 8.0mm · 1.47mm/px · 1 of 25 slices shown (10 of 18)]
[im 1/25]
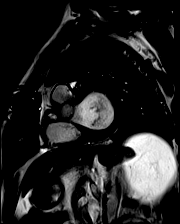

[Series 6: bSSFP · oblique · 8.0mm · 1.47mm/px · 1 of 25 slices shown (11 of 18)]
[im 1/25]
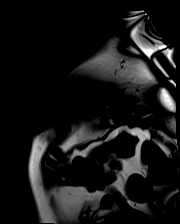

[Series 6: bSSFP · oblique · 8.0mm · 1.47mm/px · 1 of 25 slices shown (12 of 18)]
[im 1/25]
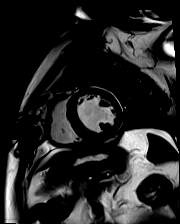

[Series 6: bSSFP · oblique · 8.0mm · 1.47mm/px · 1 of 25 slices shown (13 of 18)]
[im 1/25]
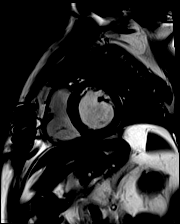

[Series 6: bSSFP · oblique · 8.0mm · 1.47mm/px · 1 of 25 slices shown (14 of 18)]
[im 1/25]
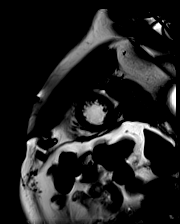

[Series 6: bSSFP · oblique · 8.0mm · 1.47mm/px · 1 of 25 slices shown (15 of 18)]
[im 1/25]
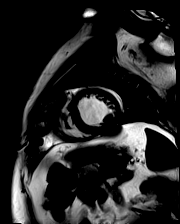

[Series 7: t2_stir_db_radial ((date)ch) · axial · 6.0mm · 1.73mm/px · 1 of 2 slices shown]
[im 1/2]
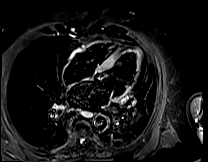

[Series 8: cine aortic valve · oblique · 5.0mm · 1.25mm/px · 1 of 25 slices shown (1 of 10)]
[im 1/25]
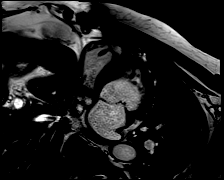

[Series 8: cine aortic valve · oblique · 5.0mm · 1.25mm/px · 1 of 25 slices shown (2 of 10)]
[im 1/25]
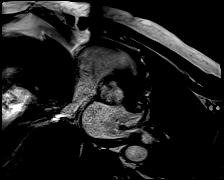

[Series 8: cine aortic valve · oblique · 5.0mm · 1.25mm/px · 1 of 25 slices shown (3 of 10)]
[im 1/25]
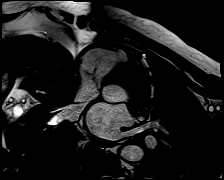

[Series 8: cine aortic valve · oblique · 5.0mm · 1.25mm/px · 1 of 25 slices shown (4 of 10)]
[im 1/25]
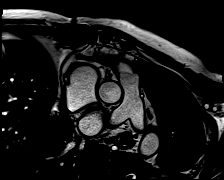

[Series 8: cine aortic valve · oblique · 5.0mm · 1.25mm/px · 1 of 25 slices shown (5 of 10)]
[im 1/25]
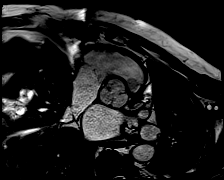

[Series 8: cine aortic valve · oblique · 5.0mm · 1.25mm/px · 1 of 25 slices shown (6 of 10)]
[im 1/25]
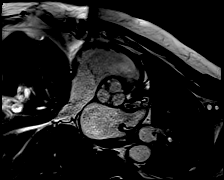

[Series 8: cine aortic valve · oblique · 5.0mm · 1.25mm/px · 1 of 25 slices shown (7 of 10)]
[im 1/25]
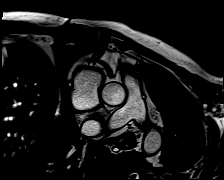

[Series 8: cine aortic valve · oblique · 5.0mm · 1.25mm/px · 1 of 25 slices shown (8 of 10)]
[im 1/25]
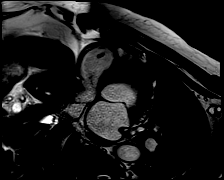

[Series 8: cine aortic valve · oblique · 5.0mm · 1.25mm/px · 1 of 25 slices shown (9 of 10)]
[im 1/25]
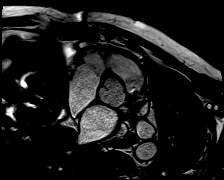

[Series 8: cine aortic valve · oblique · 5.0mm · 1.25mm/px · 1 of 25 slices shown (10 of 10)]
[im 1/25]
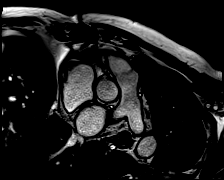

[Series 9: pc at valve · oblique · 6.0mm · 1.73mm/px · 1 of 30 slices shown]
[im 1/30]
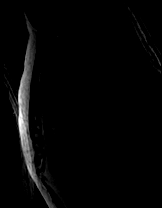

[Series 10: pc at valve_mag · oblique · 6.0mm · 1.73mm/px · 1 of 30 slices shown]
[im 1/30]
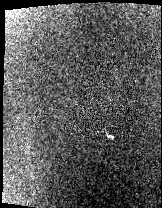

[Series 11: pc at valve_p · oblique · 6.0mm · 1.73mm/px · 1 of 30 slices shown]
[im 1/30]
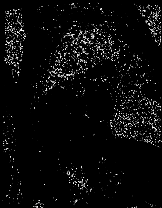

[Series 12: aorta at antrum · oblique · 6.0mm · 1.73mm/px · 1 of 30 slices shown]
[im 1/30]
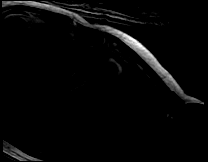

[Series 13: aorta at antrum_mag · oblique · 6.0mm · 1.73mm/px · 1 of 30 slices shown]
[im 1/30]
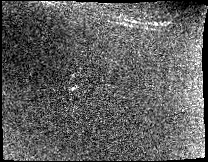

[Series 14: aorta at antrum_p · oblique · 6.0mm · 1.73mm/px · 1 of 30 slices shown]
[im 1/30]
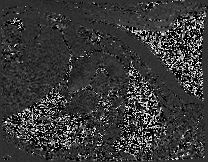

[Series 15: pc above valve · axial · 6.0mm · 1.73mm/px · 1 of 30 slices shown]
[im 1/30]
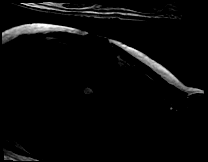

[Series 16: pc above valve_mag · axial · 6.0mm · 1.73mm/px · 1 of 28 slices shown]
[im 1/28]
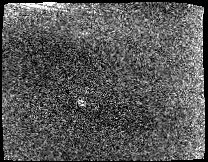

[Series 17: pc above valve_p · axial · 6.0mm · 1.73mm/px · 1 of 30 slices shown]
[im 1/30]
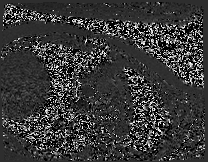

[Series 18: bSSFP · oblique · 7.0mm · 1.41mm/px · 1 of 25 slices shown (16 of 18)]
[im 1/25]
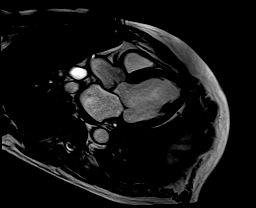

[Series 19: bSSFP · axial · 7.0mm · 1.41mm/px · 1 of 25 slices shown (17 of 18)]
[im 1/25]
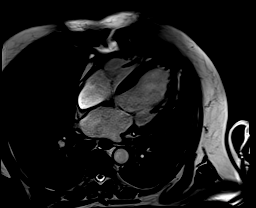

[Series 20: bSSFP · oblique · 7.0mm · 1.41mm/px · 1 of 25 slices shown (18 of 18)]
[im 1/25]
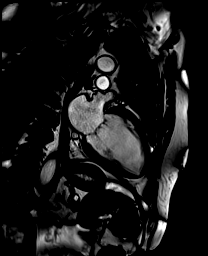

[Series 24: radial cine · oblique · 7.0mm · 1.41mm/px · 1 of 25 slices shown (1 of 4)]
[im 1/25]
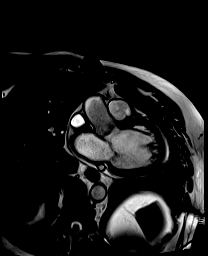

[Series 25: radial cine · coronal · 7.0mm · 1.41mm/px · 1 of 25 slices shown (2 of 4)]
[im 1/25]
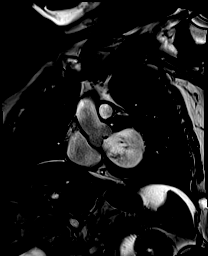

[Series 26: radial cine · oblique · 7.0mm · 1.41mm/px · 1 of 25 slices shown (3 of 4)]
[im 1/25]
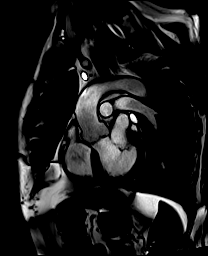

[Series 27: radial cine · oblique · 7.0mm · 1.41mm/px · 1 of 25 slices shown (4 of 4)]
[im 1/25]
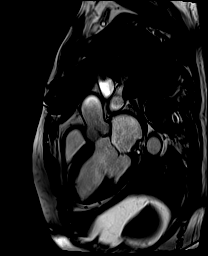

[42 of 44 positions shown; findings below may reference images not displayed]

FINDINGS: Left ventricle:

- Moderate asymmetric hypertrophy measuring up to 14mm in basal
septum (8mm in posterior wall)

- Normal size

- Moderate systolic dysfunction.  Anterior/lateral hypokinesis.

LV EF: 36% (Normal 56-78%)

Absolute volumes:

LV EDV: 190mL (Normal 77-195 mL)

LV ESV: 121mL (Normal 19-72 mL)

LV SV: 69mL (Normal 51-133 mL)

CO: 4.9L/min (Normal 2.8-8.8 L/min)

Indexed volumes:

LV EDV: 92mL/sq-m (Normal 47-92 mL/sq-m)

LV ESV: 59mL/sq-m (Normal 13-30 mL/sq-m)

LV SV: 34mL/sq-m (Normal 32-62 mL/sq-m)

CI: 2.4L/min/sq-m (Normal 1.7-4.2 L/min/sq-m)

Right ventricle: Normal size and systolic function

RV EF:  59% (Normal 47-74%)

Absolute volumes:

RV EDV: 113mL (Normal 88-227 mL)

RV ESV: 46mL (Normal 23-103 mL)

RV SV: 67mL (Normal 52-138 mL)

CO: 4.7L/min (Normal 2.8-8.8 L/min)

Indexed volumes:

RV EDV: 55mL/sq-m (Normal 55-105 mL/sq-m)

RV ESV: 22mL/sq-m (Normal 15-43 mL/sq-m)

RV SV: 32mL/sq-m (Normal 32-64 mL/sq-m)

CI: 2.3L/min/sq-m (Normal 1.7-4.2 L/min/sq-m)

Left atrium: Moderate enlargement

Right atrium: Mild enlargement

Mitral valve: No regurgitation

Aortic valve: Moderate stenosis. BILLIOT measures 1.1 cm^2 by
planimetry. Peak velocity measures 2.4 m/s

Tricuspid valve: No regurgitation

Pericardium: Normal
IMPRESSION: 1. Aortic valve area measures 1.1 cm^2 by planimetry, consistent
with moderate aortic stenosis. Peak velocity through aortic valve
measures 2.4 m/s, though can often be underestimated on cardiac MRI.
If clinical suspicion for low flow, low gradient severe aortic
stenosis, recommend aortic valve calcium score for further
evaluation

2. Normal LV size with moderate systolic dysfunction (EF 36%).
Anterior/lateral hypokinesis.

3. Moderate asymmetric hypertrophy measuring up to 14mm in basal
septum (8mm in posterior wall). Does not meet criteria for
hypertrophic cardiomyopathy (wall thickness <15mm)

4.  Normal RV size and systolic function (EF 59%)

## 2022-04-29 ENCOUNTER — Encounter: Payer: Self-pay | Admitting: Urology

## 2022-04-29 ENCOUNTER — Ambulatory Visit (INDEPENDENT_AMBULATORY_CARE_PROVIDER_SITE_OTHER): Payer: Medicare HMO | Admitting: Urology

## 2022-04-29 VITALS — BP 123/83 | HR 91

## 2022-04-29 DIAGNOSIS — N471 Phimosis: Secondary | ICD-10-CM | POA: Diagnosis not present

## 2022-04-29 MED ORDER — CLOTRIMAZOLE-BETAMETHASONE 1-0.05 % EX CREA: 1.0000 | TOPICAL_CREAM | Freq: Two times a day (BID) | CUTANEOUS | 1 refills | Status: DC

## 2022-05-03 ENCOUNTER — Telehealth: Payer: Self-pay

## 2022-05-04 NOTE — Telephone Encounter (Signed)
Apt canceled and patient informed.

## 2022-05-20 ENCOUNTER — Ambulatory Visit: Payer: Non-veteran care | Admitting: Urology

## 2022-06-23 NOTE — Telephone Encounter (Signed)
Patient called back.  He is wanting to discuss concerns with nurse.  Call back:  [phone]   Thanks, Rosey Bath

## 2022-06-23 NOTE — Telephone Encounter (Signed)
[facility] with [facility].  Patient just wanted to confirm that his last scheduled apt had been canceled due to Dr. Pete Glatter recommending a referral to [facility] or [facility].  Patient is aware no follow up is scheduled at this time.

## 2022-09-20 ENCOUNTER — Emergency Department (HOSPITAL_COMMUNITY): Payer: No Typology Code available for payment source

## 2022-09-20 ENCOUNTER — Other Ambulatory Visit: Payer: Self-pay

## 2022-09-20 ENCOUNTER — Emergency Department (HOSPITAL_COMMUNITY)
Admission: EM | Admit: 2022-09-20 | Discharge: 2022-09-21 | Disposition: A | Discharge status: Home / Self Care | Payer: No Typology Code available for payment source | Attending: Emergency Medicine | Admitting: Emergency Medicine

## 2022-09-20 ENCOUNTER — Encounter (HOSPITAL_COMMUNITY): Payer: Self-pay | Admitting: Emergency Medicine

## 2022-09-20 ENCOUNTER — Encounter (HOSPITAL_COMMUNITY): Payer: Self-pay

## 2022-09-20 DIAGNOSIS — R0602 Shortness of breath: Secondary | ICD-10-CM | POA: Insufficient documentation

## 2022-09-20 DIAGNOSIS — I10 Essential (primary) hypertension: Secondary | ICD-10-CM | POA: Diagnosis not present

## 2022-09-20 DIAGNOSIS — G459 Transient cerebral ischemic attack, unspecified: Secondary | ICD-10-CM | POA: Diagnosis not present

## 2022-09-20 DIAGNOSIS — R404 Transient alteration of awareness: Secondary | ICD-10-CM

## 2022-09-20 DIAGNOSIS — E039 Hypothyroidism, unspecified: Secondary | ICD-10-CM | POA: Diagnosis not present

## 2022-09-20 DIAGNOSIS — Z794 Long term (current) use of insulin: Secondary | ICD-10-CM | POA: Insufficient documentation

## 2022-09-20 DIAGNOSIS — R4182 Altered mental status, unspecified: Secondary | ICD-10-CM | POA: Diagnosis present

## 2022-09-20 DIAGNOSIS — Z79899 Other long term (current) drug therapy: Secondary | ICD-10-CM | POA: Diagnosis not present

## 2022-09-20 DIAGNOSIS — E119 Type 2 diabetes mellitus without complications: Secondary | ICD-10-CM | POA: Diagnosis not present

## 2022-09-20 DIAGNOSIS — Z7984 Long term (current) use of oral hypoglycemic drugs: Secondary | ICD-10-CM | POA: Insufficient documentation

## 2022-09-20 DIAGNOSIS — Z7982 Long term (current) use of aspirin: Secondary | ICD-10-CM | POA: Insufficient documentation

## 2022-09-20 LAB — TROPONIN I (HIGH SENSITIVITY)
Troponin I (High Sensitivity): 44 ng/L — ABNORMAL HIGH (ref <18)
Troponin I (High Sensitivity): 46 ng/L — ABNORMAL HIGH (ref <18)

## 2022-09-20 LAB — COMPREHENSIVE METABOLIC PANEL
ALT: 22 U/L (ref 0–44)
AST: 33 U/L (ref 15–41)
Albumin: 4.1 g/dL (ref 3.5–5.0)
Alkaline Phosphatase: 89 U/L (ref 38–126)
Anion gap: 10 (ref 5–15)
BUN: 31 mg/dL — ABNORMAL HIGH (ref 8–23)
CO2: 25 mmol/L (ref 22–32)
Calcium: 9.9 mg/dL (ref 8.9–10.3)
Chloride: 103 mmol/L (ref 98–111)
Creatinine, Ser: 1.68 mg/dL — ABNORMAL HIGH (ref 0.61–1.24)
GFR, Estimated: 42 mL/min — ABNORMAL LOW (ref >60)
Glucose, Bld: 146 mg/dL — ABNORMAL HIGH (ref 70–99)
Potassium: 4.4 mmol/L (ref 3.5–5.1)
Sodium: 138 mmol/L (ref 135–145)
Total Bilirubin: 0.6 mg/dL (ref 0.3–1.2)
Total Protein: 8.3 g/dL — ABNORMAL HIGH (ref 6.5–8.1)

## 2022-09-20 LAB — CBC
HCT: 44.2 % (ref 39.0–52.0)
Hemoglobin: 14.5 g/dL (ref 13.0–17.0)
MCH: 29.2 pg (ref 26.0–34.0)
MCHC: 32.8 g/dL (ref 30.0–36.0)
MCV: 89.1 fL (ref 80.0–100.0)
Platelets: 241 10*3/uL (ref 150–400)
RBC: 4.96 MIL/uL (ref 4.22–5.81)
RDW: 13.9 % (ref 11.5–15.5)
WBC: 8.3 10*3/uL (ref 4.0–10.5)
nRBC: 0 % (ref 0.0–0.2)

## 2022-09-20 LAB — CBG MONITORING, ED
Glucose-Capillary: 130 mg/dL — ABNORMAL HIGH (ref 70–99)
Glucose-Capillary: 73 mg/dL (ref 70–99)

## 2022-09-20 LAB — BRAIN NATRIURETIC PEPTIDE: B Natriuretic Peptide: 120 pg/mL — ABNORMAL HIGH (ref 0.0–100.0)

## 2022-09-20 MED ORDER — IOHEXOL 350 MG/ML SOLN
100.0000 mL | Freq: Once | INTRAVENOUS | Status: AC | PRN
  Administered 2022-09-20: 100 mL via INTRAVENOUS

## 2022-09-20 NOTE — ED Notes (Signed)
Patient transported to CT 

## 2022-09-20 NOTE — ED Notes (Addendum)
Pt and wife concerned that his sugar may be low due to not eating today. This RN checked sugar and it was 73.

## 2022-09-20 NOTE — ED Provider Notes (Signed)
[facility] EMERGENCY DEPARTMENT Provider Note   CSN: [id number] Arrival date & time: 09/20/22  1510     History  Chief Complaint  Patient presents with   Blurred Vision   Altered Mental Status    Travis Simon is a 76 y.o. male.  Patient as above with significant medical history as below, including DM, GERD, HLD, HTN, right-sided BKA who presents to the ED with complaint of altered mental status.  Patient accompanied by spouse.  Reports approximately 1.5 weeks ago he had a brief episode of mental status change, and vision changes.  He had binocular blurry vision for approximately 2-3 minute, felt very fatigued, global weakness, difficulty speaking.  The symptoms did resolve spontaneously after around 1 minute.  He had recurrence of the symptoms yesterday afternoon around 3 to 4 PM while he was playing a video game.  Felt blurry vision in both of his eyes, difficulty speaking, felt very weak globally, symptoms did resolve after around 1 minute.  Spouse reports he has been having some word finding difficulty since the first episode that has been intermittent, difficulty with concentration.  No behavior changes.  Patient reports at this time that he is at his baseline.  No focal numbness or tingling that seems new, no focal weakness, he feels his vision is back to normal.  Denies current difficulty with speech.  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 No language interpreter was used.  Altered Mental Status Presenting symptoms: no confusion   Associated symptoms: weakness   Associated symptoms: no abdominal pain, no agitation, no fever, no headaches, no nausea, no palpitations, no rash and no vomiting        Home Medications Prior to Admission medications   Medication Sig Start Date End Date Taking? Authorizing Provider  amoxicillin (AMOXIL) 500 MG capsule Take 2,000 mg by mouth See admin instructions. Take 2000 mg 30 to 60 minutes prior to any dental cleaning or surgical procedure. 08/18/22  Yes [provider]  aspirin EC 81 MG tablet Take by mouth.   Yes [provider]  Cholecalciferol 50 MCG (2000 UT) TABS Take by mouth.   Yes [provider]  clopidogrel (PLAVIX) 75 MG tablet Take 1 tablet by mouth daily. 08/19/22 03/06/23 Yes [provider]  empagliflozin (JARDIANCE) 25 MG TABS tablet Take 0.5 tablets by mouth daily.   Yes [provider]  furosemide (LASIX) 20 MG tablet Take 20 mg by mouth.   Yes [provider]  glipiZIDE (GLUCOTROL) 5 MG tablet Take by mouth 2 (two) times daily.   Yes [provider]  insulin glargine (LANTUS) 100 UNIT/ML injection Inject 15 Units into the skin 2 (two) times daily.   Yes [provider]  latanoprost (XALATAN) 0.005 % ophthalmic solution Place 1 drop into both eyes at bedtime.  Yes [provider]  levothyroxine (SYNTHROID, LEVOTHROID) 50 MCG tablet Take by mouth.   Yes [provider]  liraglutide (VICTOZA) 18 MG/3ML SOPN Inject 1.8 mg into the skin daily.   Yes [provider]  simvastatin (ZOCOR) 40 MG tablet Take 40 mg by mouth every evening.   Yes [provider]  tamsulosin (FLOMAX) 0.4 MG CAPS capsule Take 0.4 mg by mouth daily.   Yes [provider]      Allergies    Patient has no known allergies.    Review of  Systems   Review of Systems  Constitutional:  Positive for fatigue. Negative for chills and fever.  HENT:  Negative for facial swelling and trouble swallowing.   Eyes:  Positive for visual disturbance. Negative for photophobia.  Respiratory:  Negative for cough and shortness of breath.   Cardiovascular:  Negative for chest pain and palpitations.  Gastrointestinal:  Negative for abdominal pain, nausea and vomiting.  Endocrine: Negative for polydipsia and polyuria.  Genitourinary:  Negative for difficulty urinating and hematuria.  Musculoskeletal:  Negative for gait problem and joint swelling.  Skin:  Negative for pallor and rash.  Neurological:  Positive for speech difficulty and weakness. Negative for syncope and headaches.  Psychiatric/Behavioral:  Negative for agitation and confusion.     Physical Exam Updated Vital Signs BP (!) 153/83   Pulse 88   Resp 16   SpO2 98%  Physical Exam Vitals and nursing note reviewed.  Constitutional:      General: He is not in acute distress.    Appearance: Normal appearance. He is well-developed.  HENT:     Head: Normocephalic and atraumatic.     Jaw: There is normal jaw occlusion.     Right Ear: External ear normal.     Left Ear: External ear normal.     Mouth/Throat:     Mouth: Mucous membranes are moist.  Eyes:     General: No scleral icterus.    Extraocular Movements: Extraocular movements intact.     Pupils: Pupils are equal, round, and reactive to light.  Neck:     Trachea: Trachea normal.     Meningeal: Brudzinski's sign and Kernig's sign absent.  Cardiovascular:     Rate and Rhythm: Normal rate and regular rhythm.     Pulses: Normal pulses.     Heart sounds: Murmur heard.  Pulmonary:     Effort: Pulmonary effort is normal. No respiratory distress.     Breath sounds: Normal breath sounds.  Abdominal:     General: Abdomen is flat.     Palpations: Abdomen is soft.     Tenderness: There is no abdominal tenderness.   Musculoskeletal:        General: Normal range of motion.     Cervical back: Full passive range of motion without pain and normal range of motion.     Right lower leg: No edema.     Left lower leg: No edema.       Legs:  Skin:    General: Skin is warm and dry.     Capillary Refill: Capillary refill takes less than 2 seconds.  Neurological:     Mental Status: He is alert and oriented to person, place, and time.     GCS: GCS eye subscore is 4. GCS verbal subscore is 5. GCS motor subscore is 6.     Cranial Nerves: Cranial nerves 2-12 are intact. No dysarthria or facial asymmetry.     Sensory:  Sensation is intact. No sensory deficit.     Motor: Motor function is intact. No tremor.     Coordination: Coordination is intact.     Comments: Gait not tested 2/2 pt safety, s/p RBKA  Psychiatric:        Mood and Affect: Mood normal.        Behavior: Behavior normal.     ED Results / Procedures / Treatments   Labs (all labs ordered are listed, but only abnormal results are displayed) Labs Reviewed  COMPREHENSIVE METABOLIC PANEL - Abnormal; Notable for the following components:      Result Value   Glucose, Bld 146 (*)    BUN 31 (*)    Creatinine, Ser 1.68 (*)    Total Protein 8.3 (*)    GFR, Estimated 42 (*)    All other components within normal limits  BRAIN NATRIURETIC PEPTIDE - Abnormal; Notable for the following components:   B Natriuretic Peptide 120.0 (*)    All other components within normal limits  CBG MONITORING, ED - Abnormal; Notable for the following components:   Glucose-Capillary 130 (*)    All other components within normal limits  TROPONIN I (HIGH SENSITIVITY) - Abnormal; Notable for the following components:   Troponin I (High Sensitivity) 44 (*)    All other components within normal limits  TROPONIN I (HIGH SENSITIVITY) - Abnormal; Notable for the following components:   Troponin I (High Sensitivity) 46 (*)    All other components within normal limits  CBC  CBG  MONITORING, ED    EKG EKG Interpretation  Date/Time:  Tuesday September 20 2022 17:15:56 EST Ventricular Rate:  82 PR Interval:    QRS Duration: 163 QT Interval:  401 QTC Calculation: 469 R Axis:   -76 Text Interpretation: Left bundle branch block Confirmed by Tanda Rockers ([id number]) on 09/21/2022 12:03:31 AM  Radiology CT ANGIO HEAD NECK W WO CM  Result Date: 09/20/2022 CLINICAL DATA:  Transient ischemic attack EXAM: CT ANGIOGRAPHY HEAD AND NECK TECHNIQUE: Multidetector CT imaging of the head and neck was performed using the standard protocol during bolus administration of intravenous contrast. Multiplanar CT image reconstructions and MIPs were obtained to evaluate the vascular anatomy. Carotid stenosis measurements (when applicable) are obtained utilizing NASCET criteria, using the distal internal carotid diameter as the denominator. RADIATION DOSE REDUCTION: This exam was performed according to the departmental dose-optimization program which includes automated exposure control, adjustment of the mA and/or kV according to patient size and/or use of iterative reconstruction technique. CONTRAST:  OMNIPAQUE IOHEXOL 350 MG/ML SOLN COMPARISON:  None Available. FINDINGS: CTA NECK FINDINGS SKELETON: There is no bony spinal canal stenosis. No lytic or blastic lesion. OTHER NECK: Normal pharynx, larynx and major salivary glands. No cervical lymphadenopathy. Unremarkable thyroid gland. UPPER CHEST: No pneumothorax or pleural effusion. No nodules or masses. AORTIC ARCH: There is calcific atherosclerosis of the aortic arch. There is no aneurysm, dissection or hemodynamically significant stenosis of the visualized portion of the aorta. Conventional 3 vessel aortic branching pattern. The visualized proximal subclavian arteries are widely patent. RIGHT CAROTID SYSTEM: No dissection, occlusion or aneurysm. There is calcified atherosclerosis extending into the proximal ICA, resulting in less than 50% stenosis.  LEFT CAROTID SYSTEM: No dissection, occlusion or aneurysm. Mild atherosclerotic calcification at the carotid bifurcation without hemodynamically significant stenosis. VERTEBRAL ARTERIES: Left dominant configuration. Both origins are clearly patent. There is no dissection, occlusion or flow-limiting stenosis to the skull base (V1-V3 segments). CTA HEAD FINDINGS POSTERIOR CIRCULATION: --Vertebral arteries:  Left vertebral artery mild atherosclerosis without hemodynamically significant stenosis. --Inferior cerebellar arteries: Normal. --Basilar artery: Normal. --Superior cerebellar arteries: Normal. --Posterior cerebral arteries (PCA): Normal. ANTERIOR CIRCULATION: --Intracranial internal carotid arteries: Atherosclerotic calcification of the internal carotid arteries at the skull base without hemodynamically significant stenosis. --Anterior cerebral arteries (ACA): Normal. Both A1 segments are present. Patent anterior communicating artery (a-comm). --Middle cerebral arteries (MCA): Normal. VENOUS SINUSES: As permitted by contrast timing, patent. ANATOMIC VARIANTS: None Review of the MIP images confirms the above findings. IMPRESSION: 1. No emergent large vessel occlusion or hemodynamically significant stenosis of the head or neck. 2. Bilateral carotid bifurcation atherosclerosis without hemodynamically significant stenosis. Aortic atherosclerosis (ICD10-I70.0). Electronically Signed   By: Deatra Robinson M.D.   On: 09/20/2022 22:57   CT HEAD WO CONTRAST ( )  Result Date: 09/20/2022 CLINICAL DATA:  Blurred vision, confusion. EXAM: CT HEAD WITHOUT CONTRAST TECHNIQUE: Contiguous axial images were obtained from the base of the skull through the vertex without intravenous contrast. RADIATION DOSE REDUCTION: This exam was performed according to the departmental dose-optimization program which includes automated exposure control, adjustment of the mA and/or kV according to patient size and/or use of iterative  reconstruction technique. COMPARISON:  April 18, 2006. FINDINGS: Brain: No evidence of acute infarction, hemorrhage, hydrocephalus, extra-axial collection or mass lesion/mass effect. Vascular: No hyperdense vessel or unexpected calcification. Skull: Normal. Negative for fracture or focal lesion. Sinuses/Orbits: No acute finding. Other: None. IMPRESSION: No acute intracranial abnormality seen. Electronically Signed   By: Lupita Raider M.D.   On: 09/20/2022 18:33   DG Chest Portable 1 View  Result Date: 09/20/2022 CLINICAL DATA:  Weakness recent heart surgery EXAM: PORTABLE CHEST 1 VIEW COMPARISON:  Chest x-ray report 12/12/2020 FINDINGS: Post sternotomy changes with valve replacement. Small electronic device over the cardiac silhouette probably a leadless pacemaker. Normal cardiac size. Aortic atherosclerosis. No pneumothorax IMPRESSION: 1. No acute airspace disease 2. Postsurgical changes of mediastinum with valve prosthesis and probable lead less pacemaker Electronically Signed   By: Jasmine Pang M.D.   On: 09/20/2022 17:33    Procedures Procedures    Medications Ordered in ED Medications  iohexol (OMNIPAQUE) 350 MG/ML injection 100 mL (100 mLs Intravenous Contrast Given 09/20/22 2224)    ED Course/ Medical Decision Making/ A&P Clinical Course as of 09/21/22 0019  Tue Sep 20, 2022  2357 ABCD2 is 3  Dw Dr Iver Nestle neuro regarding presentation and findings, pt cannot receive MRI 2/2 pacemaker, CTH and CTA were stable. Echo was a few days ago. No other further workup needed at this time per neurology. Recommend he f/u with cardiology for event recorder/monitor. O/w stable for o/p f/u [SG]    Clinical Course User Index [SG] Sloan Leiter, DO                           Medical Decision Making Amount and/or Complexity of Data Reviewed Labs: ordered. Radiology: ordered.  Risk Prescription drug management.   This patient presents to the ED with chief complaint(s) of weakness, speech  change, vision change with pertinent past medical history of TAVR, CABG, DM, HLD, HTN which further complicates the presenting complaint. The complaint involves an extensive differential diagnosis and also carries with it a high risk of complications and morbidity.    The differential diagnosis includes but not limited to TIA, CVA, metabolic, infection, infarction, ACS, medication effect, respiratory etc. Serious etiologies were considered.   The initial plan is to screening labs and  imaging Patient is not stroke alert activation candidate given low suspicion for LVO given improvement/resolution of symptoms, I am more suspicious for TIA vs cardiac    Additional history obtained: Additional history obtained from spouse Records reviewed previous admission documents, Care Everywhere/External Records, and Primary Care Documents prior labs/imaging   Independent labs interpretation:  The following labs were independently interpreted:  trop is elevated, delta is flat; no cp; EKG w/ lbbb (known hx of this) BNP is mildly elevated but only 120 CBC wnl CMP with elev Cr, similar to his baseline  Independent visualization of imaging: - I independently visualized the following imaging with scope of interpretation limited to determining acute life threatening conditions related to emergency care: [facility] CTA head/neck CXR, which revealed post op changes, no lvo, no acute infarct on ct  Cardiac monitoring was reviewed and interpreted by myself which shows lbbb  Treatment and Reassessment: Po challenge >> asymptomatic  Consultation: - Consulted or discussed management/test interpretation w/ external professional: Dr Iver Nestle neuro  Consideration for admission or further workup: Admission was considered   Pt with possible TIA, ABCD2 is 3/low risk, recent echo, cannot have MRI 2/2 pacemaker, CT stable, he is asymptomatic. No need for inpatient w/u per neurology. Advise he f/u with cardiology for event  monitor/further eval. Pt remains asymptomatic, pt/family comfortably with plan.   The patient improved significantly and was discharged in stable condition. Detailed discussions were had with the patient regarding current findings, and need for close f/u with PCP or on call doctor. The patient has been instructed to return immediately if the symptoms worsen in any way for re-evaluation. Patient verbalized understanding and is in agreement with current care plan. All questions answered prior to discharge.    Social Determinants of health: [facility], occ [facility] use  Social History   Tobacco Use   Smoking status: Never   Smokeless tobacco: Never  Substance Use Topics   Alcohol use: Yes    Comment: beer or two a  month   Drug use: Never            Final Clinical Impression(s) / ED Diagnoses Final diagnoses:  Transient alteration of awareness  TIA (transient ischemic attack)    Rx / DC Orders ED Discharge Orders          Ordered    Ambulatory referral to Neurology       Comments: An appointment is requested in approximately: 2 weeks   09/20/22 2355              Sloan Leiter, DO 09/21/22 0019

## 2022-09-20 NOTE — Discharge Instructions (Addendum)
Please follow-up with your cardiologist for evaluation of event recorder/monitor  Possible you have had a TIA, unfortunately you cannot receive MRI due to your pacemaker.   It was a pleasure caring for you today in the emergency department.  Please return to the emergency department for any worsening or worrisome symptoms.

## 2022-09-20 NOTE — ED Triage Notes (Signed)
Pt just had heart valve replacement/stent surgery 2 weeks ago. C/o being confused and having blurred vision since that has been intermittent. Denies any now. Pt ambulatory. Pt is a/o. Nad at this time. States has had chest tightness since before and after surgery that is intermittent. Wife at bedside. States all symptoms are intermittent

## 2022-09-21 NOTE — ED Provider Notes (Incomplete)
[facility] EMERGENCY DEPARTMENT Provider Note   CSN: [id number] Arrival date & time: 09/20/22  1510     History {Add pertinent medical, surgical, social history, OB history to HPI:1} Chief Complaint  Patient presents with  . Blurred Vision  . Altered Mental Status    Travis Simon is a 76 y.o. male.  Patient as above with significant medical history as below, including DM, GERD, HLD, HTN, right-sided BKA who presents to the ED with complaint of altered mental status.  Patient accompanied by spouse.  Reports approximately 1.5 weeks ago he had a brief episode of mental status change, and vision changes.  He had binocular blurry vision for approximately 2-3 minute, felt very fatigued, global weakness, difficulty speaking.  The symptoms did resolve spontaneously after around 1 minute.  He had recurrence of the symptoms yesterday afternoon around 3 to 4 PM while he was playing a video game.  Felt blurry vision in both of his eyes, difficulty speaking, felt very weak globally, symptoms did resolve after around 1 minute.  Spouse reports he has been having some word finding difficulty since the first episode that has been intermittent, difficulty with concentration.  No behavior changes.  Patient reports at this time that he is at his baseline.  No focal numbness or tingling that seems new, no focal weakness, he feels his vision is back to normal.  Denies current difficulty with speech.  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      Past Medical History:  Diagnosis Date  . 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 Diabetes (HCC)   . GERD (gastroesophageal reflux disease)   . History of methicillin resistant staphylococcus aureus (MRSA)   . Hyperlipidemia   . Hypertension   . Hypothyroidism     Past Surgical History:  Procedure Laterality Date  . BELOW  KNEE LEG AMPUTATION Right 2013  . TOE AMPUTATION Left    Hallux     The history is provided by the patient and the spouse. No language interpreter was used.  Altered Mental Status Presenting symptoms: no confusion   Associated symptoms: weakness   Associated symptoms: no abdominal pain, no agitation, no fever, no headaches, no nausea, no palpitations, no rash and no vomiting        Home Medications Prior to Admission medications   Medication Sig Start Date End Date Taking? Authorizing Provider  amoxicillin (AMOXIL) 500 MG capsule Take 2,000 mg by mouth See admin instructions. Take 2000 mg 30 to 60 minutes prior to any dental cleaning or surgical procedure. 08/18/22  Yes [provider]  aspirin EC 81 MG tablet Take by mouth.   Yes [provider]  Cholecalciferol 50 MCG (2000 UT) TABS Take by mouth.   Yes [provider]  clopidogrel (PLAVIX) 75 MG tablet Take 1 tablet by mouth daily. 08/19/22 03/06/23 Yes [provider]  empagliflozin (JARDIANCE) 25 MG TABS tablet Take 0.5 tablets by mouth daily.   Yes [provider]  furosemide (LASIX) 20 MG tablet Take 20 mg by mouth.   Yes [provider]  glipiZIDE (GLUCOTROL) 5 MG tablet Take by mouth 2 (two) times daily.   Yes [provider]  insulin glargine (LANTUS) 100 UNIT/ML injection Inject 15 Units into the skin 2 (two) times daily.   Yes [provider]  latanoprost (XALATAN) 0.005 % ophthalmic  solution Place 1 drop into both eyes at bedtime.   Yes [provider]  levothyroxine (SYNTHROID, LEVOTHROID) 50 MCG tablet Take by mouth.   Yes [provider]  liraglutide (VICTOZA) 18 MG/3ML SOPN Inject 1.8 mg into the skin daily.   Yes [provider]  simvastatin (ZOCOR) 40 MG tablet Take 40 mg by mouth every evening.   Yes [provider]  tamsulosin (FLOMAX) 0.4 MG CAPS capsule Take 0.4 mg by mouth daily.   Yes [provider]      Allergies    Patient has no known allergies.    Review of Systems   Review of Systems  Constitutional:  Positive for fatigue. Negative for chills and fever.  HENT:  Negative for facial swelling and trouble swallowing.   Eyes:  Positive for visual disturbance. Negative for photophobia.  Respiratory:  Negative for cough and shortness of breath.   Cardiovascular:  Negative for chest pain and palpitations.  Gastrointestinal:  Negative for abdominal pain, nausea and vomiting.  Endocrine: Negative for polydipsia and polyuria.  Genitourinary:  Negative for difficulty urinating and hematuria.  Musculoskeletal:  Negative for gait problem and joint swelling.  Skin:  Negative for pallor and rash.  Neurological:  Positive for speech difficulty and weakness. Negative for syncope and headaches.  Psychiatric/Behavioral:  Negative for agitation and confusion.     Physical Exam Updated Vital Signs BP 108/68   Pulse 84   Resp 11   SpO2 95%  Physical Exam Vitals and nursing note reviewed.  Constitutional:      General: He is not in acute distress.    Appearance: Normal appearance. He is well-developed.  HENT:     Head: Normocephalic and atraumatic.     Jaw: There is normal jaw occlusion.     Right Ear: External ear normal.     Left Ear: External ear normal.     Mouth/Throat:     Mouth: Mucous membranes are moist.  Eyes:     General: No scleral icterus.    Extraocular Movements: Extraocular movements intact.     Pupils: Pupils are equal, round, and reactive to light.  Neck:     Trachea: Trachea normal.     Meningeal: Brudzinski's sign and Kernig's sign absent.  Cardiovascular:     Rate and Rhythm: Normal rate and regular rhythm.     Pulses: Normal pulses.     Heart sounds: Murmur heard.  Pulmonary:     Effort: Pulmonary effort is normal. No respiratory distress.     Breath sounds: Normal breath sounds.  Abdominal:     General: Abdomen is flat.     Palpations: Abdomen is  soft.     Tenderness: There is no abdominal tenderness.  Musculoskeletal:        General: Normal range of motion.     Cervical back: Full passive range of motion without pain and normal range of motion.     Right lower leg: No edema.     Left lower leg: No edema.       Legs:  Skin:    General: Skin is warm and dry.     Capillary Refill: Capillary refill takes less than 2 seconds.  Neurological:     Mental Status: He is alert and oriented to person, place, and time.     GCS: GCS eye subscore is 4. GCS verbal subscore is 5. GCS motor subscore is 6.     Cranial Nerves: Cranial nerves 2-12 are intact.  No dysarthria or facial asymmetry.     Sensory: Sensation is intact. No sensory deficit.     Motor: Motor function is intact. No tremor.     Coordination: Coordination is intact.     Comments: Gait not tested 2/2 pt safety, s/p RBKA  Psychiatric:        Mood and Affect: Mood normal.        Behavior: Behavior normal.     ED Results / Procedures / Treatments   Labs (all labs ordered are listed, but only abnormal results are displayed) Labs Reviewed  COMPREHENSIVE METABOLIC PANEL - Abnormal; Notable for the following components:      Result Value   Glucose, Bld 146 (*)    BUN 31 (*)    Creatinine, Ser 1.68 (*)    Total Protein 8.3 (*)    GFR, Estimated 42 (*)    All other components within normal limits  BRAIN NATRIURETIC PEPTIDE - Abnormal; Notable for the following components:   B Natriuretic Peptide 120.0 (*)    All other components within normal limits  CBG MONITORING, ED - Abnormal; Notable for the following components:   Glucose-Capillary 130 (*)    All other components within normal limits  TROPONIN I (HIGH SENSITIVITY) - Abnormal; Notable for the following components:   Troponin I (High Sensitivity) 44 (*)    All other components within normal limits  TROPONIN I (HIGH SENSITIVITY) - Abnormal; Notable for the following components:   Troponin I (High Sensitivity) 46 (*)     All other components within normal limits  CBC    EKG None  Radiology CT HEAD WO CONTRAST (5MM)  Result Date: 09/20/2022 CLINICAL DATA:  Blurred vision, confusion. EXAM: CT HEAD WITHOUT CONTRAST TECHNIQUE: Contiguous axial images were obtained from the base of the skull through the vertex without intravenous contrast. RADIATION DOSE REDUCTION: This exam was performed according to the departmental dose-optimization program which includes automated exposure control, adjustment of the mA and/or kV according to patient size and/or use of iterative reconstruction technique. COMPARISON:  April 18, 2006. FINDINGS: Brain: No evidence of acute infarction, hemorrhage, hydrocephalus, extra-axial collection or mass lesion/mass effect. Vascular: No hyperdense vessel or unexpected calcification. Skull: Normal. Negative for fracture or focal lesion. Sinuses/Orbits: No acute finding. Other: None. IMPRESSION: No acute intracranial abnormality seen. Electronically Signed   By: Lupita RaiderJames  Green Jr M.D.   On: 09/20/2022 18:33   DG Chest Portable 1 View  Result Date: 09/20/2022 CLINICAL DATA:  Weakness recent heart surgery EXAM: PORTABLE CHEST 1 VIEW COMPARISON:  Chest x-ray report 12/12/2020 FINDINGS: Post sternotomy changes with valve replacement. Small electronic device over the cardiac silhouette probably a leadless pacemaker. Normal cardiac size. Aortic atherosclerosis. No pneumothorax IMPRESSION: 1. No acute airspace disease 2. 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   On: 09/20/2022 17:33    Procedures Procedures  {Document cardiac monitor, telemetry assessment procedure when appropriate:1}  Medications Ordered in ED Medications - No data to display  ED Course/ Medical Decision Making/ A&P Clinical Course as of 09/21/22 0002  Tue Sep 20, 2022  2357 ABCD2 is 3  Dw Dr Iver NestleBhagat neuro regarding presentation and  findings, pt cannot receive MRI 2/2 pacemaker, CTH and CTA were stable. Echo was a few days ago. No other further workup needed at this time per neurology. Recommend he f/u with cardiology for event recorder/monitor. O/w stable for o/p f/u [SG]  Clinical Course User Index [SG] Sloan Leiter, DO                           Medical Decision Making Amount and/or Complexity of Data Reviewed Labs: ordered. Radiology: ordered.  Risk Prescription drug management.   This patient presents to the ED with chief complaint(s) of weakness, speech change, vision change with pertinent past medical history of TAVR, CABG, DM, HLD, HTN which further complicates the presenting complaint. The complaint involves an extensive differential diagnosis and also carries with it a high risk of complications and morbidity.    The differential diagnosis includes but not limited to TIA, CVA, metabolic, infection, infarction, ACS, medication effect, respiratory etc. Serious etiologies were considered.   The initial plan is to screening labs and imaging Patient is not stroke alert activation candidate given low suspicion for LVO given improvement/resolution of symptoms, I am more suspicious for TIA vs cardiac    Additional history obtained: Additional history obtained from spouse Records reviewed previous admission documents, Care Everywhere/External Records, and Primary Care Documents prior labs/imaging   Independent labs interpretation:  The following labs were independently interpreted:  It is elevated, delta is flat  Independent visualization of imaging: - I independently visualized the following imaging with scope of interpretation limited to determining acute life threatening conditions related to emergency care: [facility] CTA head/neck CXR, which revealed post op changes, no lvo, no acute infarct on ct  Cardiac monitoring was reviewed and interpreted by myself which shows lbbb  Treatment and Reassessment: Po  challenge >> asymptomatic   Consultation: - Consulted or discussed management/test interpretation w/ external professional: Dr Iver Nestle neuro  Consideration for admission or further workup: Admission was considered   Pt with possible TIA, ABCD2 is 3, recent echo, cannot have MRI 2/2 pacemaker, CT stable, he is asymptomatic. No need for inpatient w/u per  The patient improved significantly and was discharged in stable condition. Detailed discussions were had with the patient regarding current findings, and need for close f/u with PCP or on call doctor. The patient has been instructed to return immediately if the symptoms worsen in any way for re-evaluation. Patient verbalized understanding and is in agreement with current care plan. All questions answered prior to discharge.    Social Determinants of health: Social History   Tobacco Use  . Smoking status: Never  . Smokeless tobacco: Never  Substance Use Topics  . Alcohol use: Yes    Comment: beer or two a  month  . Drug use: Never      {Document critical care time when appropriate:1} {Document review of labs and clinical decision tools ie heart score, Chads2Vasc2 etc:1}  {Document your independent review of radiology images, and any outside records:1} {Document your discussion with family members, caretakers, and with consultants:1} {Document social determinants of health affecting pt's care:1} {Document your decision making why or why not admission, treatments were needed:1} Final Clinical Impression(s) / ED Diagnoses Final diagnoses:  None    Rx / DC Orders ED Discharge Orders     None
